# Patient Record
Sex: Female | Born: 1985 | Race: White | Hispanic: No | Marital: Single | Smoking: Never smoker
Health system: Southern US, Community
[De-identification: ages and names within clinical notes are randomized; demographics above are authoritative.]

## PROBLEM LIST (undated history)

## (undated) DIAGNOSIS — F988 Other specified behavioral and emotional disorders with onset usually occurring in childhood and adolescence: Secondary | ICD-10-CM

## (undated) DIAGNOSIS — A64 Unspecified sexually transmitted disease: Secondary | ICD-10-CM

## (undated) HISTORY — PX: WISDOM TOOTH EXTRACTION: SHX21

## (undated) HISTORY — PX: OTHER SURGICAL HISTORY: SHX169

## (undated) HISTORY — DX: Unspecified sexually transmitted disease: A64

## (undated) HISTORY — DX: Other specified behavioral and emotional disorders with onset usually occurring in childhood and adolescence: F98.8

---
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
## 2005-05-17 ENCOUNTER — Other Ambulatory Visit: Admission: RE | Admit: 2005-05-17 | Discharge: 2005-05-17 | Payer: Self-pay | Admitting: *Deleted

## 2005-05-17 ENCOUNTER — Other Ambulatory Visit: Admission: RE | Admit: 2005-05-17 | Discharge: 2005-05-17 | Payer: Self-pay | Admitting: Obstetrics and Gynecology

## 2006-07-18 ENCOUNTER — Other Ambulatory Visit: Admission: RE | Admit: 2006-07-18 | Discharge: 2006-07-18 | Payer: Self-pay | Admitting: Obstetrics & Gynecology

## 2007-08-18 ENCOUNTER — Other Ambulatory Visit: Admission: RE | Admit: 2007-08-18 | Discharge: 2007-08-18 | Payer: Self-pay | Admitting: Obstetrics and Gynecology

## 2008-09-14 ENCOUNTER — Other Ambulatory Visit: Admission: RE | Admit: 2008-09-14 | Discharge: 2008-09-14 | Payer: Self-pay | Admitting: Obstetrics and Gynecology

## 2009-10-06 ENCOUNTER — Encounter: Admission: RE | Admit: 2009-10-06 | Discharge: 2009-10-06 | Payer: Self-pay | Admitting: Emergency Medicine

## 2011-06-15 IMAGING — CT CT HEAD W/O CM
2 series · 16 of 30 positions shown, 20 images · non-contrast
Comparison: None.

CLINICAL DATA: Seizure activity yesterday.

CT HEAD WITHOUT CONTRAST
TECHNIQUE: Contiguous axial images were obtained from the base of
the skull through the vertex without contrast.

[Series 2: head wo · axial · 0.49mm/px · z∈[+189,+300]mm · 13 of 26 slices shown, 17 images]
[im 2/26  brain]
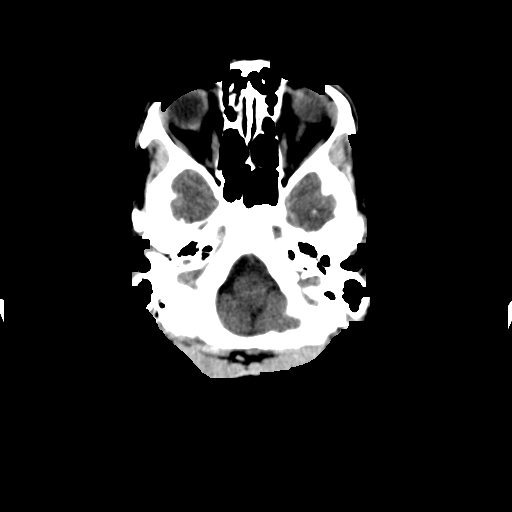
[im 2/26  bone]
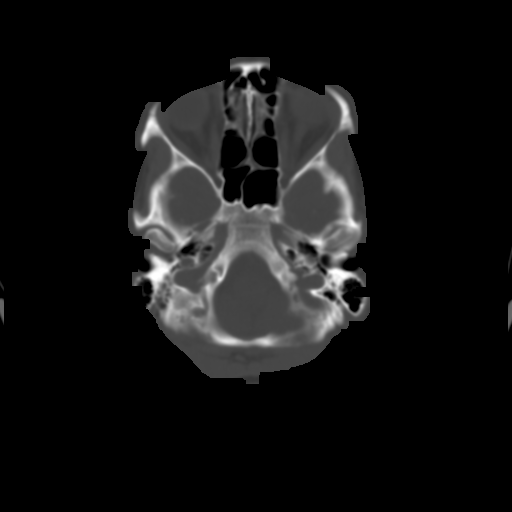
[im 4/26  brain]
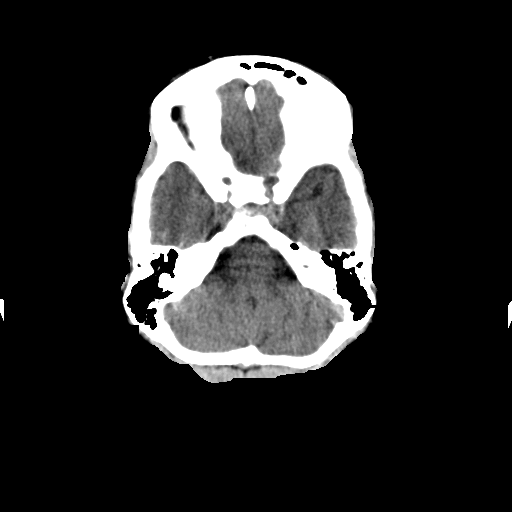
[im 6/26  brain]
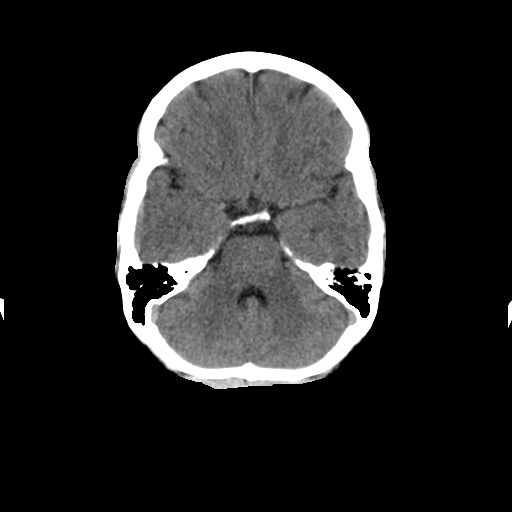
[im 8/26  brain]
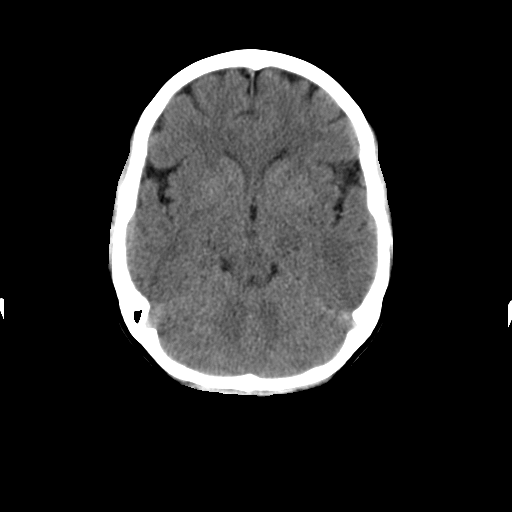
[im 9/26  brain]
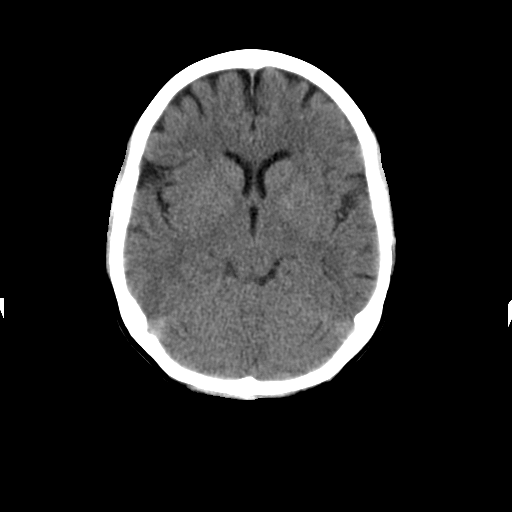
[im 9/26  bone]
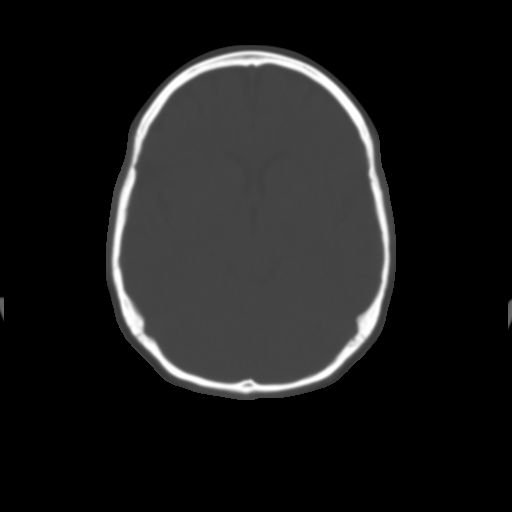
[im 11/26  brain]
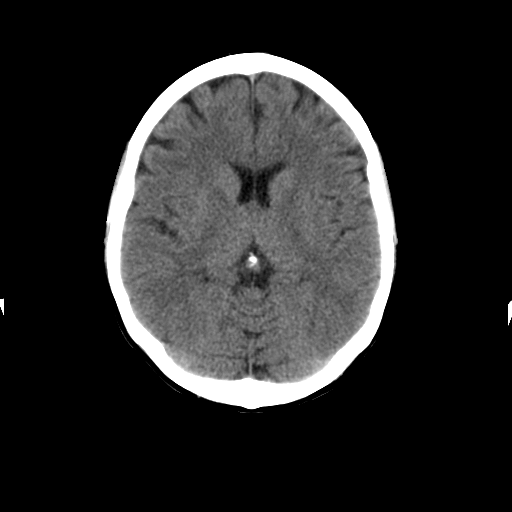
[im 13/26  brain]
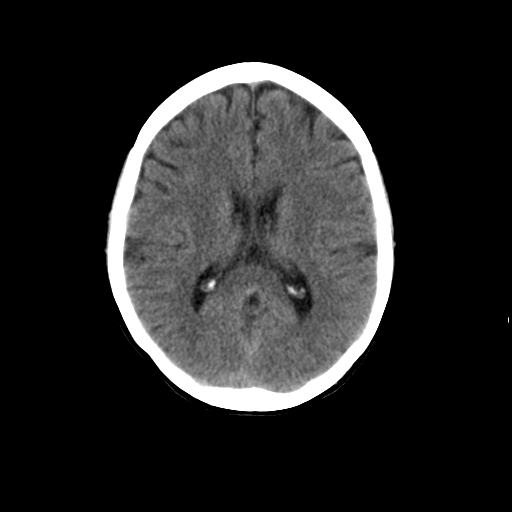
[im 15/26  brain]
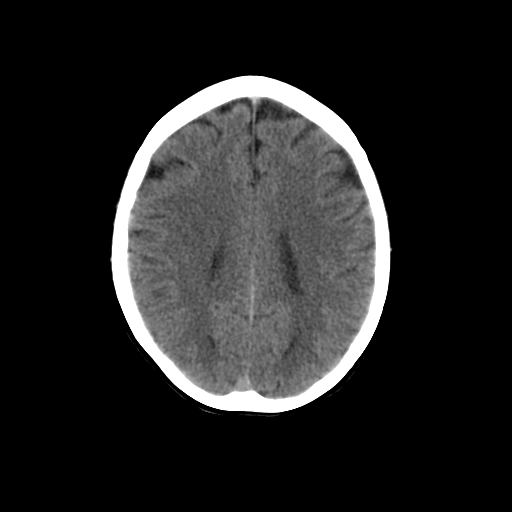
[im 17/26  brain]
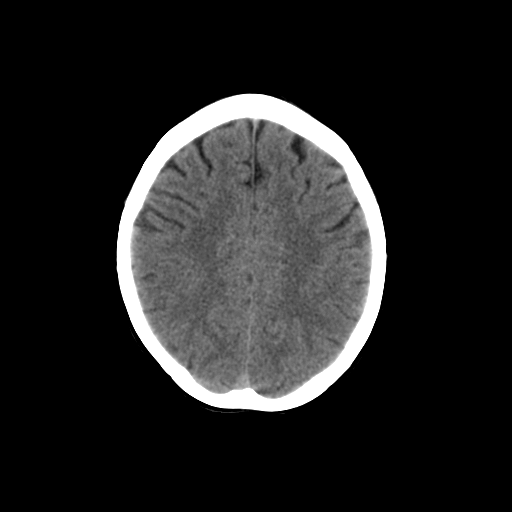
[im 17/26  bone]
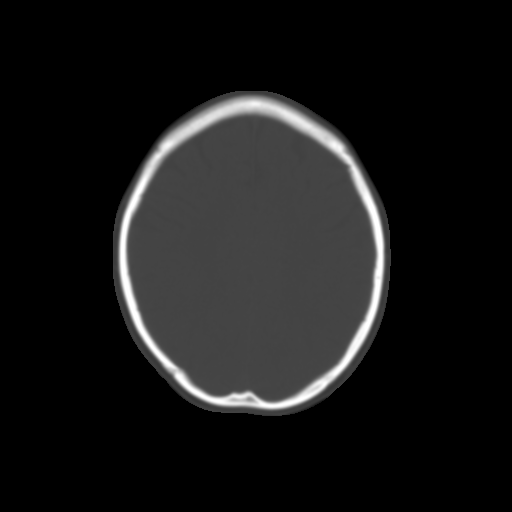
[im 18/26  brain]
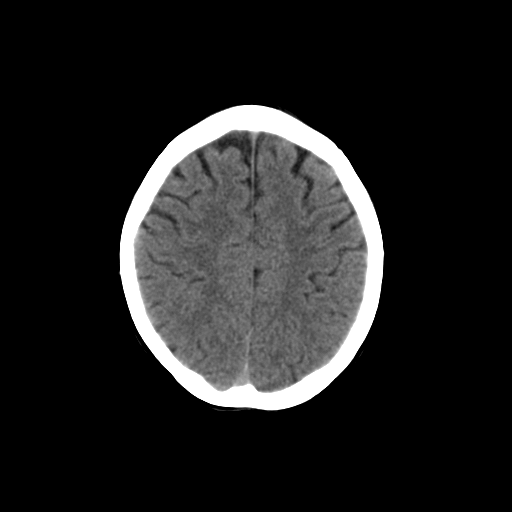
[im 20/26  brain]
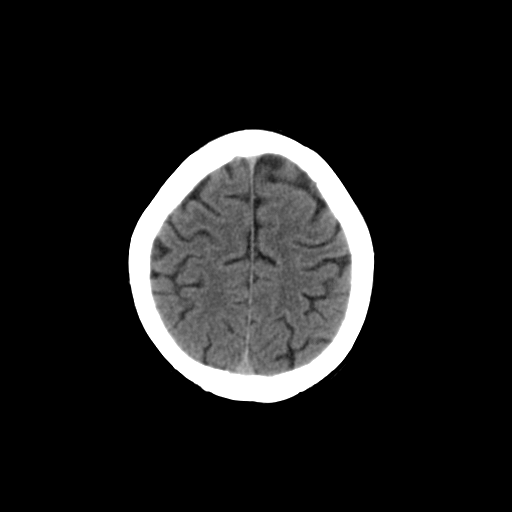
[im 22/26  brain]
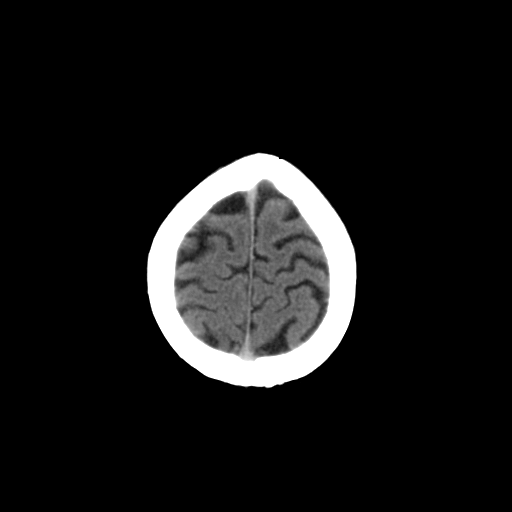
[im 24/26  brain]
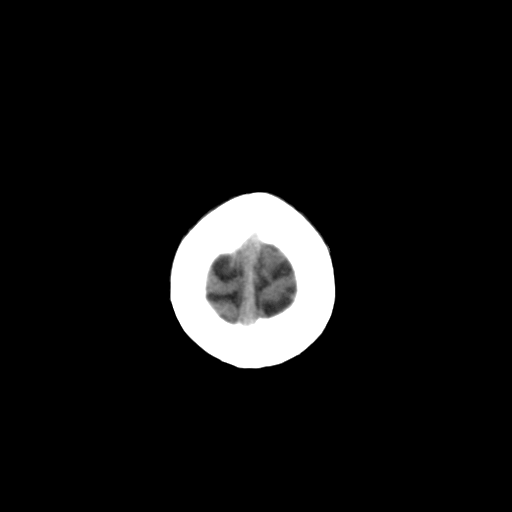
[im 24/26  bone]
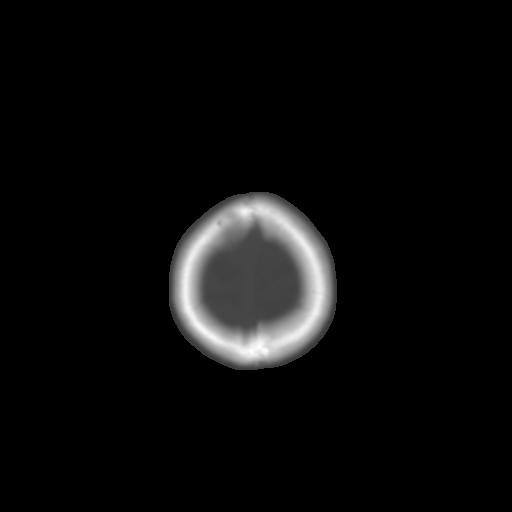

[Series 3: head bone · axial · 0.49mm/px · z∈[+189,+225]mm · 3 of 26 slices shown]
[im 2/26  bone]
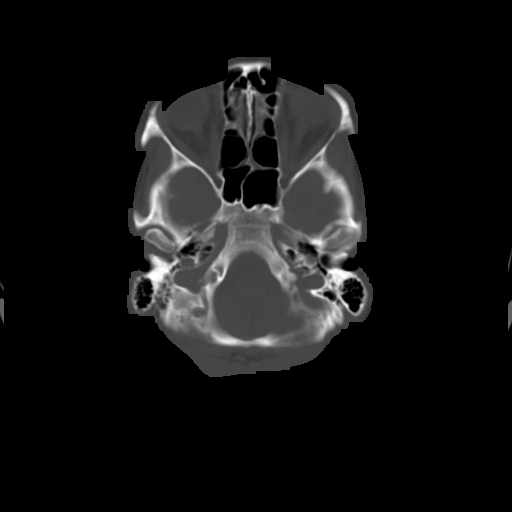
[im 6/26  bone]
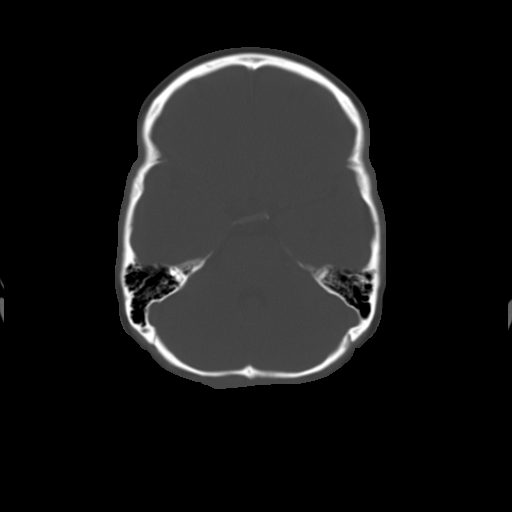
[im 9/26  bone]
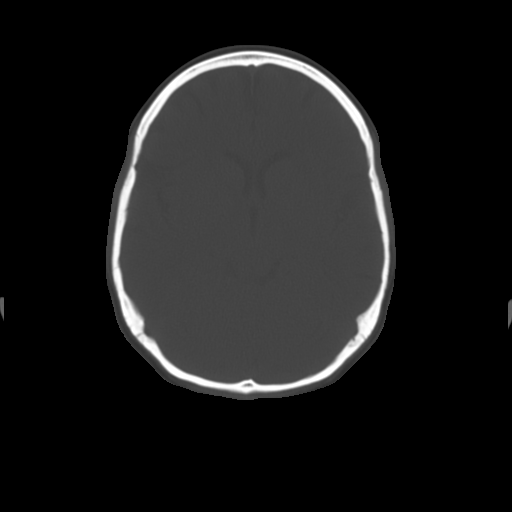

[16 of 30 positions shown; findings below may reference images not displayed]

FINDINGS: Bone windows demonstrate clear paranasal sinuses and
mastoid air cells.

Soft tissue windows demonstrate no  mass lesion, hemorrhage,
hydrocephalus, acute infarct, intra-axial, or extra-axial fluid
collection.
IMPRESSION: Normal head CT

## 2011-08-16 ENCOUNTER — Telehealth: Payer: Self-pay

## 2011-08-16 MED ORDER — AMPHETAMINE-DEXTROAMPHETAMINE 15 MG PO TABS: 15.0000 mg | ORAL_TABLET | Freq: Every day | ORAL | Status: DC

## 2011-08-16 NOTE — Telephone Encounter (Signed)
PT IN NEED OF HER ADDERALL 3 MONTHS SUPPLY PLEASE CALL [phone]

## 2011-08-16 NOTE — Telephone Encounter (Signed)
Addended by: Anders Simmonds on: 08/16/2011 08:30 PM   Modules accepted: Orders

## 2011-08-16 NOTE — Telephone Encounter (Signed)
Chart pulled to PA 

## 2011-08-16 NOTE — Telephone Encounter (Signed)
Please pull and then to PA for review.

## 2011-08-17 NOTE — Telephone Encounter (Signed)
Notified pt her request was received and is ready.

## 2011-08-22 ENCOUNTER — Telehealth: Payer: Self-pay

## 2011-08-22 NOTE — Telephone Encounter (Signed)
Deanna Chang FROM [facility] STATES THEY HAVE QUESTIONS REGARDING THE DIRECTIONS ON HER ADDERALL. PLEASE CALL [phone]   [facility]

## 2011-08-22 NOTE — Telephone Encounter (Signed)
Changed directions to bid - Spoke to pharmacist

## 2011-09-21 ENCOUNTER — Telehealth: Payer: Self-pay

## 2011-09-21 MED ORDER — AMPHETAMINE-DEXTROAMPHETAMINE 15 MG PO TABS: 15.0000 mg | ORAL_TABLET | Freq: Two times a day (BID) | ORAL | Status: DC

## 2011-09-21 NOTE — Telephone Encounter (Signed)
Pt is needing a refill on her Adderall 15mg  2 daily 60 pills

## 2011-09-21 NOTE — Telephone Encounter (Signed)
LMOM to pick rx at front desk.

## 2011-10-26 ENCOUNTER — Other Ambulatory Visit: Payer: Self-pay

## 2011-10-26 NOTE — Telephone Encounter (Signed)
Pt requesting rx refill on adderall Please call when ready for pick-up.

## 2011-10-28 NOTE — Telephone Encounter (Signed)
Please pull paper chart.  

## 2011-10-29 MED ORDER — AMPHETAMINE-DEXTROAMPHETAMINE 15 MG PO TABS: 15.0000 mg | ORAL_TABLET | Freq: Two times a day (BID) | ORAL | Status: DC

## 2011-10-29 NOTE — Telephone Encounter (Signed)
LMOM that Rx is ready and she is due for f/up bf next RF

## 2011-10-29 NOTE — Telephone Encounter (Signed)
Chart pulled to PA 

## 2011-10-29 NOTE — Telephone Encounter (Signed)
RX is ready to pick up.  Please let her know she is due for an office visit before she runs out.

## 2011-11-26 ENCOUNTER — Ambulatory Visit (INDEPENDENT_AMBULATORY_CARE_PROVIDER_SITE_OTHER): Payer: 59 | Admitting: Internal Medicine

## 2011-11-26 VITALS — BP 136/75 | HR 67 | Temp 98.3°F | Resp 16 | Ht 63.0 in | Wt 113.0 lb

## 2011-11-26 DIAGNOSIS — F988 Other specified behavioral and emotional disorders with onset usually occurring in childhood and adolescence: Secondary | ICD-10-CM

## 2011-11-26 MED ORDER — AMPHETAMINE-DEXTROAMPHETAMINE 15 MG PO TABS: 15.0000 mg | ORAL_TABLET | Freq: Two times a day (BID) | ORAL | Status: DC

## 2011-11-26 NOTE — Progress Notes (Signed)
  Subjective:    Patient ID: Deanna Chang, female    DOB: Jan 20, 1986, 26 y.o.   MRN: [id number]  HPIADD f/u  Doing very well w-s orthopedics xray tech Not needing meds as much Wants to try half dose Ran 1st marathon at [facility] No side effects    Review of Systems     Objective:   Physical Exam NAD VS WNL       Assessment & Plan:  ADD Meds ordered this encounter  Medications  . amphetamine-dextroamphetamine (ADDERALL) 15 MG tablet    Sig: Take 1 tablet (15 mg total) by mouth 2 (two) times daily. NEEDS OFFICE visit before runs out    Dispense:  60 tablet    Refill:  0  . amphetamine-dextroamphetamine (ADDERALL) 15 MG tablet    Sig: Take 1 tablet (15 mg total) by mouth 2 (two) times daily.    Dispense:  60 tablet    Refill:  0  . amphetamine-dextroamphetamine (ADDERALL) 15 MG tablet    Sig: Take 1 tablet (15 mg total) by mouth 2 (two) times daily.    Dispense:  60 tablet    Refill:  0   May try half doses Call 3 months Discussed rhodiola and zinc

## 2011-12-16 ENCOUNTER — Telehealth: Payer: Self-pay

## 2011-12-16 NOTE — Telephone Encounter (Signed)
Patient states that she needs a print out of her last ov for her flex spending and is wanting to know if she could get a Rx for the supplement that Dr Merla Riches suggested so that her flex spending will pay for the Supplement.

## 2011-12-16 NOTE — Telephone Encounter (Signed)
Can we rx Rhodiola and zinc for patient?

## 2011-12-16 NOTE — Telephone Encounter (Signed)
Dr. Merla Riches to prescribe to ensure correct dosage and sig

## 2011-12-17 NOTE — Telephone Encounter (Signed)
Called pt to pick up RX and information from Dr Merla Riches, [facility] to pick up.

## 2011-12-17 NOTE — Telephone Encounter (Signed)
Zinc 15-25 mg per day Losenges Magnesium glycinate 200-400 mg per day Rhodiola 100-200mg  /day

## 2012-12-08 ENCOUNTER — Other Ambulatory Visit: Payer: Self-pay | Admitting: Certified Nurse Midwife

## 2012-12-08 NOTE — Telephone Encounter (Signed)
eScribe request for refill on CRYSELLE Last filled - 01/03/12 X 1 YEAR Last AEX - 01/03/12 Next AEX - 01/15/13 RX sent (2 packs).

## 2013-01-15 ENCOUNTER — Ambulatory Visit: Payer: 59 | Admitting: Certified Nurse Midwife

## 2013-01-20 ENCOUNTER — Encounter: Payer: Self-pay | Admitting: Certified Nurse Midwife

## 2013-01-21 ENCOUNTER — Encounter: Payer: Self-pay | Admitting: Certified Nurse Midwife

## 2013-01-21 ENCOUNTER — Ambulatory Visit (INDEPENDENT_AMBULATORY_CARE_PROVIDER_SITE_OTHER): Payer: BC Managed Care – PPO | Admitting: Certified Nurse Midwife

## 2013-01-21 VITALS — BP 110/64 | HR 64 | Resp 16 | Ht 63.25 in | Wt 124.0 lb

## 2013-01-21 DIAGNOSIS — Z0000 Encounter for general adult medical examination without abnormal findings: Secondary | ICD-10-CM

## 2013-01-21 DIAGNOSIS — Z309 Encounter for contraceptive management, unspecified: Secondary | ICD-10-CM

## 2013-01-21 DIAGNOSIS — Z01419 Encounter for gynecological examination (general) (routine) without abnormal findings: Secondary | ICD-10-CM

## 2013-01-21 DIAGNOSIS — IMO0001 Reserved for inherently not codable concepts without codable children: Secondary | ICD-10-CM

## 2013-01-21 DIAGNOSIS — B009 Herpesviral infection, unspecified: Secondary | ICD-10-CM

## 2013-01-21 LAB — POCT URINALYSIS DIPSTICK
Bilirubin, UA: NEGATIVE
Blood, UA: NEGATIVE
Glucose, UA: NEGATIVE
Ketones, UA: NEGATIVE
Leukocytes, UA: NEGATIVE
Nitrite, UA: NEGATIVE
Protein, UA: NEGATIVE
Urobilinogen, UA: NEGATIVE
pH, UA: 5

## 2013-01-21 LAB — HIV ANTIBODY (ROUTINE TESTING W REFLEX): HIV: NONREACTIVE

## 2013-01-21 MED ORDER — NORGESTREL-ETHINYL ESTRADIOL 0.3-30 MG-MCG PO TABS: 1.0000 | ORAL_TABLET | Freq: Every day | ORAL | Status: DC

## 2013-01-21 MED ORDER — VALACYCLOVIR HCL 1 G PO TABS: 1000.0000 mg | ORAL_TABLET | ORAL | Status: AC | PRN

## 2013-01-21 NOTE — Patient Instructions (Signed)
General topics  Next pap or exam is  due in 1 year Take a Women's multivitamin Take 1200 mg. of calcium daily - prefer dietary If any concerns in interim to call back  Breast Self-Awareness Practicing breast self-awareness may pick up problems early, prevent significant medical complications, and possibly save your life. By practicing breast self-awareness, you can become familiar with how your breasts look and feel and if your breasts are changing. This allows you to notice changes early. It can also offer you some reassurance that your breast health is good. One way to learn what is normal for your breasts and whether your breasts are changing is to do a breast self-exam. If you find a lump or something that was not present in the past, it is best to contact your caregiver right away. Other findings that should be evaluated by your caregiver include nipple discharge, especially if it is bloody; skin changes or reddening; areas where the skin seems to be pulled in (retracted); or new lumps and bumps. Breast pain is seldom associated with cancer (malignancy), but should also be evaluated by a caregiver. BREAST SELF-EXAM The best time to examine your breasts is 5 7 days after your menstrual period is over.  ExitCare Patient Information 2013 [employer].   Exercise to Stay Healthy Exercise helps you become and stay healthy. EXERCISE IDEAS AND TIPS Choose exercises that:  You enjoy.  Fit into your day. You do not need to exercise really hard to be healthy. You can do exercises at a slow or medium level and stay healthy. You can:  Stretch before and after working out.  Try yoga, Pilates, or tai chi.  Lift weights.  Walk fast, swim, jog, run, climb stairs, bicycle, dance, or rollerskate.  Take aerobic classes. Exercises that burn about 150 calories:  Running 1  miles in 15 minutes.  Playing volleyball for 45 to 60 minutes.  Washing and waxing a car for 45 to 60  minutes.  Playing touch football for 45 minutes.  Walking 1  miles in 35 minutes.  Pushing a stroller 1  miles in 30 minutes.  Playing basketball for 30 minutes.  Raking leaves for 30 minutes.  Bicycling 5 miles in 30 minutes.  Walking 2 miles in 30 minutes.  Dancing for 30 minutes.  Shoveling snow for 15 minutes.  Swimming laps for 20 minutes.  Walking up stairs for 15 minutes.  Bicycling 4 miles in 15 minutes.  Gardening for 30 to 45 minutes.  Jumping rope for 15 minutes.  Washing windows or floors for 45 to 60 minutes. Document Released: 06/16/2010 Document Revised: 08/06/2011 Document Reviewed: 06/16/2010 ExitCare Patient Information 2013 [employer].   Other topics ( that may be useful information):    Sexually Transmitted Disease Sexually transmitted disease (STD) refers to any infection that is passed from person to person during sexual activity. This may happen by way of saliva, semen, blood, vaginal mucus, or urine. Common STDs include:  Gonorrhea.  Chlamydia.  Syphilis.  HIV/AIDS.  Genital herpes.  Hepatitis B and C.  Trichomonas.  Human papillomavirus (HPV).  Pubic lice. CAUSES  An STD may be spread by bacteria, virus, or parasite. A person can get an STD by:  Sexual intercourse with an infected person.  Sharing sex toys with an infected person.  Sharing needles with an infected person.  Having intimate contact with the genitals, mouth, or rectal areas of an infected person. SYMPTOMS  Some people may not have any symptoms, but   they can still pass the infection to others. Different STDs have different symptoms. Symptoms include:  Painful or bloody urination.  Pain in the pelvis, abdomen, vagina, anus, throat, or eyes.  Skin rash, itching, irritation, growths, or sores (lesions). These usually occur in the genital or anal area.  Abnormal vaginal discharge.  Penile discharge in men.  Soft, flesh-colored skin growths in the  genital or anal area.  Fever.  Pain or bleeding during sexual intercourse.  Swollen glands in the groin area.  Yellow skin and eyes (jaundice). This is seen with hepatitis. DIAGNOSIS  To make a diagnosis, your caregiver may:  Take a medical history.  Perform a physical exam.  Take a specimen (culture) to be examined.  Examine a sample of discharge under a microscope.  Perform blood test TREATMENT   Chlamydia, gonorrhea, trichomonas, and syphilis can be cured with antibiotic medicine.  Genital herpes, hepatitis, and HIV can be treated, but not cured, with prescribed medicines. The medicines will lessen the symptoms.  Genital warts from HPV can be treated with medicine or by freezing, burning (electrocautery), or surgery. Warts may come back.  HPV is a virus and cannot be cured with medicine or surgery.However, abnormal areas may be followed very closely by your caregiver and may be removed from the cervix, vagina, or vulva through office procedures or surgery. If your diagnosis is confirmed, your recent sexual partners need treatment. This is true even if they are symptom-free or have a negative culture or evaluation. They should not have sex until their caregiver says it is okay. HOME CARE INSTRUCTIONS  All sexual partners should be informed, tested, and treated for all STDs.  Take your antibiotics as directed. Finish them even if you start to feel better.  Only take over-the-counter or prescription medicines for pain, discomfort, or fever as directed by your caregiver.  Rest.  Eat a balanced diet and drink enough fluids to keep your urine clear or pale yellow.  Do not have sex until treatment is completed and you have followed up with your caregiver. STDs should be checked after treatment.  Keep all follow-up appointments, Pap tests, and blood tests as directed by your caregiver.  Only use latex condoms and water-soluble lubricants during sexual activity. Do not use  petroleum jelly or oils.  Avoid alcohol and illegal drugs.  Get vaccinated for HPV and hepatitis. If you have not received these vaccines in the past, talk to your caregiver about whether one or both might be right for you.  Avoid risky sex practices that can break the skin. The only way to avoid getting an STD is to avoid all sexual activity.Latex condoms and dental dams (for oral sex) will help lessen the risk of getting an STD, but will not completely eliminate the risk. SEEK MEDICAL CARE IF:   You have a fever.  You have any new or worsening symptoms. Document Released: 08/04/2002 Document Revised: 08/06/2011 Document Reviewed: 08/11/2010 ExitCare Patient Information 2013 [employer].    Domestic Abuse You are being battered or abused if someone close to you hits, pushes, or physically hurts you in any way. You also are being abused if you are forced into activities. You are being sexually abused if you are forced to have sexual contact of any kind. You are being emotionally abused if you are made to feel worthless or if you are constantly threatened. It is important to remember that help is available. No one has the right to abuse you. PREVENTION OF FURTHER   ABUSE  Learn the warning signs of danger. This varies with situations but may include: the use of alcohol, threats, isolation from friends and family, or forced sexual contact. Leave if you feel that violence is going to occur.  If you are attacked or beaten, report it to the police so the abuse is documented. You do not have to press charges. The police can protect you while you or the attackers are leaving. Get the officer's name and badge number and a copy of the report.  Find someone you can trust and tell them what is happening to you: your caregiver, a nurse, clergy member, close friend or family member. Feeling ashamed is natural, but remember that you have done nothing wrong. No one deserves abuse. Document Released:  05/11/2000 Document Revised: 08/06/2011 Document Reviewed: 07/20/2010 ExitCare Patient Information 2013 [employer].    How Much is Too Much Alcohol? Drinking too much alcohol can cause injury, accidents, and health problems. These types of problems can include:   Car crashes.  Falls.  Family fighting (domestic violence).  Drowning.  Fights.  Injuries.  Burns.  Damage to certain organs.  Having a baby with birth defects. ONE DRINK CAN BE TOO MUCH WHEN YOU ARE:  Working.  Pregnant or breastfeeding.  Taking medicines. Ask your doctor.  Driving or planning to drive. If you or someone you know has a drinking problem, get help from a doctor.  Document Released: 03/10/2009 Document Revised: 08/06/2011 Document Reviewed: 03/10/2009 ExitCare Patient Information 2013 [employer].   Smoking Hazards Smoking cigarettes is extremely bad for your health. Tobacco smoke has over 200 known poisons in it. There are over 60 chemicals in tobacco smoke that cause cancer. Some of the chemicals found in cigarette smoke include:   Cyanide.  Benzene.  Formaldehyde.  Methanol (wood alcohol).  Acetylene (fuel used in welding torches).  Ammonia. Cigarette smoke also contains the poisonous gases nitrogen oxide and carbon monoxide.  Cigarette smokers have an increased risk of many serious medical problems and Smoking causes approximately:  90% of all lung cancer deaths in men.  80% of all lung cancer deaths in women.  90% of deaths from chronic obstructive lung disease. Compared with nonsmokers, smoking increases the risk of:  Coronary heart disease by 2 to 4 times.  Stroke by 2 to 4 times.  Men developing lung cancer by 23 times.  Women developing lung cancer by 13 times.  Dying from chronic obstructive lung diseases by 12 times.  . Smoking is the most preventable cause of death and disease in our society.  WHY IS SMOKING ADDICTIVE?  Nicotine is the chemical  agent in tobacco that is capable of causing addiction or dependence.  When you smoke and inhale, nicotine is absorbed rapidly into the bloodstream through your lungs. Nicotine absorbed through the lungs is capable of creating a powerful addiction. Both inhaled and non-inhaled nicotine may be addictive.  Addiction studies of cigarettes and spit tobacco show that addiction to nicotine occurs mainly during the teen years, when young people begin using tobacco products. WHAT ARE THE BENEFITS OF QUITTING?  There are many health benefits to quitting smoking.   Likelihood of developing cancer and heart disease decreases. Health improvements are seen almost immediately.  Blood pressure, pulse rate, and breathing patterns start returning to normal soon after quitting. QUITTING SMOKING   American Lung Association - 1-800-LUNGUSA  American Cancer Society - 1-800-ACS-2345 Document Released: 06/21/2004 Document Revised: 08/06/2011 Document Reviewed: 02/23/2009 ExitCare Patient Information 2013 ExitCare,   LLC.   Stress Management Stress is a state of physical or mental tension that often results from changes in your life or normal routine. Some common causes of stress are:  Death of a loved one.  Injuries or severe illnesses.  Getting fired or changing jobs.  Moving into a new home. Other causes may be:  Sexual problems.  Business or financial losses.  Taking on a large debt.  Regular conflict with someone at home or at work.  Constant tiredness from lack of sleep. It is not just bad things that are stressful. It may be stressful to:  Win the lottery.  Get married.  Buy a new car. The amount of stress that can be easily tolerated varies from person to person. Changes generally cause stress, regardless of the types of change. Too much stress can affect your health. It may lead to physical or emotional problems. Too little stress (boredom) may also become stressful. SUGGESTIONS TO  REDUCE STRESS:  Talk things over with your family and friends. It often is helpful to share your concerns and worries. If you feel your problem is serious, you may want to get help from a professional counselor.  Consider your problems one at a time instead of lumping them all together. Trying to take care of everything at once may seem impossible. List all the things you need to do and then start with the most important one. Set a goal to accomplish 2 or 3 things each day. If you expect to do too many in a single day you will naturally fail, causing you to feel even more stressed.  Do not use alcohol or drugs to relieve stress. Although you may feel better for a short time, they do not remove the problems that caused the stress. They can also be habit forming.  Exercise regularly - at least 3 times per week. Physical exercise can help to relieve that "uptight" feeling and will relax you.  The shortest distance between despair and hope is often a good night's sleep.  Go to bed and get up on time allowing yourself time for appointments without being rushed.  Take a short "time-out" period from any stressful situation that occurs during the day. Close your eyes and take some deep breaths. Starting with the muscles in your face, tense them, hold it for a few seconds, then relax. Repeat this with the muscles in your neck, shoulders, hand, stomach, back and legs.  Take good care of yourself. Eat a balanced diet and get plenty of rest.  Schedule time for having fun. Take a break from your daily routine to relax. HOME CARE INSTRUCTIONS   Call if you feel overwhelmed by your problems and feel you can no longer manage them on your own.  Return immediately if you feel like hurting yourself or someone else. Document Released: 11/07/2000 Document Revised: 08/06/2011 Document Reviewed: 06/30/2007 ExitCare Patient Information 2013 [employer].   

## 2013-01-21 NOTE — Progress Notes (Signed)
27 y.o. G0P0000 Single Caucasian Fe here for annual exam.  Periods normal. Contraception working well, but may consider, long term method, IUD,nexplanon. Desires STD screening. Patient has been off Aderal for a year now doing well. No health issues today.  Patient's last menstrual period was 12/29/2012.          Sexually active: yes  The current method of family planning is OCP (estrogen/progesterone).    Exercising: yes  power yoga,circuit training with weights, running & biking Smoker:  no  Health Maintenance: Pap:  01-03-12 neg MMG:  none Colonoscopy:  none BMD:   none TDaP:  2006 Labs: Poct urine-neg, hgb-13.7 Self breast exam: done monthly   reports that she has never smoked. She does not have any smokeless tobacco history on file. She reports that she drinks about 1.5 ounces of alcohol per week. She reports that she does not use illicit drugs.  Past Medical History  Diagnosis Date  . STD (sexually transmitted disease)     HSV1  . ADD (attention deficit disorder)     Past Surgical History  Procedure Laterality Date  . Wisdom tooth extraction    . Jaw history      Current Outpatient Prescriptions  Medication Sig Dispense Refill  . IBUPROFEN PO Take by mouth as needed.      . norgestrel-ethinyl estradiol (CRYSELLE-28) 0.3-30 MG-MCG tablet TAKE 1 TABLET BY MOUTH EVERY DAY  28 tablet  1  . valACYclovir (VALTREX) 1000 MG tablet Take 1,000 mg by mouth as needed.       No current facility-administered medications for this visit.    Family History  Problem Relation Age of Onset  . Stroke Maternal Grandmother     ROS:  Pertinent items are noted in HPI.  Otherwise, a comprehensive ROS was negative.  Exam:   BP 110/64  Pulse 64  Resp 16  Ht 5' 3.25" (1.607 m)  Wt 124 lb (56.246 kg)  BMI 21.78 kg/m2  LMP 12/29/2012 Height: 5' 3.25" (160.7 cm)  Ht Readings from Last 3 Encounters:  01/21/13 5' 3.25" (1.607 m)  11/26/11 5\' 3"  (1.6 m)    General appearance: alert,  cooperative and appears stated age Head: Normocephalic, without obvious abnormality, atraumatic Neck: no adenopathy, supple, symmetrical, trachea midline and thyroid normal to inspection and palpation Lungs: clear to auscultation bilaterally Breasts: normal appearance, no masses or tenderness, No nipple retraction or dimpling, No nipple discharge or bleeding, No axillary or supraclavicular adenopathy Heart: regular rate and rhythm Abdomen: soft, non-tender; no masses,  no organomegaly Extremities: extremities normal, atraumatic, no cyanosis or edema Skin: Skin color, texture, turgor normal. No rashes or lesions Lymph nodes: Cervical, supraclavicular, and axillary nodes normal. No abnormal inguinal nodes palpated Neurologic: Grossly normal   Pelvic: External genitalia:  no lesions              Urethra:  normal appearing urethra with no masses, tenderness or lesions              Bartholin's and Skene's: normal                 Vagina: normal appearing vagina with normal color and discharge, no lesions              Cervix: normal, non tender              Pap taken: no Bimanual Exam:  Uterus:  normal size, contour, position, consistency, mobility, non-tender and anteverted  Adnexa: normal adnexa and no mass, fullness, tenderness               Rectovaginal: Confirms               Anus:deferred  A:  Well Woman with normal exam  Contraception desires OCP, but requests IUD, Nexplanon information  STD screening  History of HSV I, needs Rx  P:   Reviewed health and wellness pertinent to exam  Rx Cryselle see order Given information on insertion,removal and bleeding profile with SKYLA,Mirena, and Nexplanon. Questions addressed. Given information to call insurance. Aware will need insertion on period, so continue OCP ;use. Will call if decides to make change.  Lab GC,Chlamydia, HIV,RPR  Rx Valtrex see order  Pap smear as per guidelines   pap smear not taken today  counseled on  breast self exam, STD prevention, HIV risk factors and prevention, use and side effects of OCP's, adequate intake of calcium and vitamin D, diet and exercise  return annually or prn  An After Visit Summary was printed and given to the patient.

## 2013-01-21 NOTE — Progress Notes (Signed)
Note reviewed, agree with plan.  Rewa Weissberg, MD  

## 2013-01-22 LAB — IPS N GONORRHOEA AND CHLAMYDIA BY PCR

## 2013-04-02 ENCOUNTER — Other Ambulatory Visit: Payer: Self-pay

## 2013-08-12 ENCOUNTER — Telehealth: Payer: Self-pay | Admitting: Certified Nurse Midwife

## 2013-08-12 NOTE — Telephone Encounter (Signed)
Spoke with patient. Increased vaginal discharge. Requests office visit for Friday. Declines office visit for today. Scheduled appointment with Dr. Farrel GobbleLathrop for Friday.  Routing to provider for final review. Patient agreeable to disposition. Will close encounter

## 2013-08-12 NOTE — Telephone Encounter (Signed)
Patient is having some green discharge.(started last night) And for the last month has been having itching and some discharge off and on but has not been treated. NO change in sexual partners or any other habits. Does a lot of hot yoga and that is usually when she shad the symptoms but when she changes it goes away.

## 2013-08-14 ENCOUNTER — Ambulatory Visit: Payer: BC Managed Care – PPO | Admitting: Gynecology

## 2013-08-14 ENCOUNTER — Ambulatory Visit (INDEPENDENT_AMBULATORY_CARE_PROVIDER_SITE_OTHER): Payer: BC Managed Care – PPO | Admitting: Obstetrics & Gynecology

## 2013-08-14 ENCOUNTER — Encounter: Payer: Self-pay | Admitting: Obstetrics & Gynecology

## 2013-08-14 VITALS — BP 122/80 | HR 64 | Resp 16 | Ht 63.25 in | Wt 120.8 lb

## 2013-08-14 DIAGNOSIS — N912 Amenorrhea, unspecified: Secondary | ICD-10-CM

## 2013-08-14 DIAGNOSIS — N898 Other specified noninflammatory disorders of vagina: Secondary | ICD-10-CM

## 2013-08-14 MED ORDER — METRONIDAZOLE 500 MG PO TABS: 500.0000 mg | ORAL_TABLET | Freq: Two times a day (BID) | ORAL | Status: DC

## 2013-08-14 NOTE — Progress Notes (Signed)
Subjective:     Patient ID: Deanna AskewJessica L Chang, female   DOB: [birth year], 28 y.o.   MRN: [id number]  HPI 28 yo G0 SWF here for complaints for vaginal discharge and itching a little over a month ago.  Felt like yeast to her except there wasn't much itching.  Tuesday, after yoga, the notice bright greenish discharge.  There is a little mild odor.    With same partner x 2 years.   H/O chlamydia.  Requests STD testing today.  Denies pelvic pain, fever, dysuria.  Hasn't had a cycle since 12/14.  Stopped OCPs.  Is using condoms.  Ok if gets pregnant.  [place]2 marathon.  Wished luck!  Review of Systems  All other systems reviewed and are negative.       Objective:   Physical Exam  Constitutional: She is oriented to person, place, and time. She appears well-developed and well-nourished.  Abdominal: Soft. Bowel sounds are normal.  Genitourinary: Uterus normal. There is no rash, tenderness or lesion on the right labia. There is no rash, tenderness or lesion on the left labia. Cervix exhibits friability (mild). Right adnexum displays no mass and no tenderness. Left adnexum displays no mass and no tenderness. No erythema or tenderness around the vagina. Vaginal discharge (greenish and frothy) found.  Wet smear:  Ph 5.0.  Saline with nl epithelial cells.  No clue cells.  No trich.  KOH without yeast and no whiff.  Musculoskeletal: Normal range of motion.  Lymphadenopathy:       Right: No inguinal adenopathy present.       Left: No inguinal adenopathy present.  Neurological: She is alert and oriented to person, place, and time.  Psychiatric: She has a normal mood and affect.       Assessment:     Vaginal discharge Amenorrhea     Plan:     Gc/Chl pending TSH, Qual HCG, FSH, prolactin  Flagyl 500mg  bid x 7 days.  rx to pharmacy.

## 2013-08-15 LAB — FOLLICLE STIMULATING HORMONE: FSH: 7.2 m[IU]/mL

## 2013-08-15 LAB — HCG, SERUM, QUALITATIVE: PREG SERUM: NEGATIVE

## 2013-08-15 LAB — PROLACTIN: Prolactin: 2.7 ng/mL

## 2013-08-15 LAB — TSH: TSH: 1.759 u[IU]/mL (ref 0.350–4.500)

## 2013-08-18 LAB — IPS N GONORRHOEA AND CHLAMYDIA BY PCR

## 2013-08-19 ENCOUNTER — Telehealth: Payer: Self-pay

## 2013-08-19 NOTE — Telephone Encounter (Signed)
Message copied by Elisha HeadlandNIX, Aeva Posey S on Wed Aug 19, 2013 11:39 AM ------      Message from: Jerene BearsMILLER, MARY S      Created: Tue Aug 18, 2013 12:52 PM       Pt needs to take Provera 10mg  x 10 days to start cycle as has not has one since December.  Please call and discuss.  Informed of normal results via Mychart but that you would call to discuss the Provera.  No order placed yet.            Deanna Chang,      Your lab tests are normal.  [facility].  Your thyroid test is normal.  A pregnancy test was negative.  And the prolactin test was normal.  You should have a cycle about every three to four months.  My nurse will call and talk with you about this.  The STD testing isn't back yet.            Dr. Hyacinth MeekerMiller ------

## 2013-08-19 NOTE — Telephone Encounter (Signed)
Lmtcb//kn 

## 2013-08-20 MED ORDER — MEDROXYPROGESTERONE ACETATE 10 MG PO TABS: 10.0000 mg | ORAL_TABLET | Freq: Every day | ORAL | Status: DC

## 2013-08-20 NOTE — Telephone Encounter (Signed)
Pt says her Rx for provera was not at the [facility] at [phone]

## 2013-08-20 NOTE — Telephone Encounter (Signed)
Message left to return call [facility] [phone].   Mychart message sent as well. Rx sent to [facility] [employer].

## 2013-08-26 NOTE — Telephone Encounter (Signed)
Did anybody actually speak to my patient?  I want Ms. Sutphin to call and give report after taking the provera to advise if she either has + bleeding or neg bleeding.  Can you please call her?

## 2013-08-26 NOTE — Telephone Encounter (Signed)
Patient has read my chart message. Okay to close encounter?

## 2013-08-27 NOTE — Telephone Encounter (Signed)
Left message to call Kaitlyn at [phone]. 

## 2013-08-27 NOTE — Telephone Encounter (Signed)
Patient is calling Kaitlyn back °

## 2013-08-27 NOTE — Telephone Encounter (Signed)
Spoke with patient. Message from Dr.Miller given (as seen below). Advised she should begin to take the Provera 10 mg for 10 days. Instructed to call our office back after she has completed Provera to let us know if she has or has not started a period. Advised this could take up to two weeks after completing Provera. Patient agreeable and verbalizes understanding. Will call back to let us know results.  Routing to provider for final review. Patient agreeable to disposition. Will close encounter

## 2013-09-26 ENCOUNTER — Encounter: Payer: Self-pay | Admitting: Obstetrics & Gynecology

## 2013-09-28 ENCOUNTER — Telehealth: Payer: Self-pay | Admitting: Obstetrics & Gynecology

## 2013-09-28 ENCOUNTER — Telehealth: Payer: Self-pay | Admitting: *Deleted

## 2013-09-28 NOTE — Telephone Encounter (Signed)
Message left to return [facility] to [facility] [phone].   Accidentally closed encounter. Will addend when patient returns call.

## 2013-09-28 NOTE — Telephone Encounter (Signed)
Patient is returning a call, not sure to who. It looks like Kennon RoundsSally left a msg for patient.?

## 2013-09-28 NOTE — Telephone Encounter (Signed)
[facility] to [facility], ask for [facility].

## 2013-09-28 NOTE — Telephone Encounter (Signed)
From [name], RN [[id number]][[id number]] To Deanna AskewJessica Chang Sterne Composed 09/28/2013 1:49 PM For Delivery On 09/28/2013 1:49 PM Subject RE: Non-Urgent Medical Question Message Type Patient Medical Advice Request Read Status N By Deanna AskewJessica Chang [name] has not read the message Message Body [name], I left a message on your VM to call the office to review with triage. We need to ask questions and see more about your symptoms.   ----- Message -----  From: Deanna Chang,Deanna Chang  Sent: 09/26/2013 3:50 PM EDT  To: Annamaria BootsMILLER, MARY SUZANNE, MD  Subject: Non-Urgent Medical Question   I finished the Flagyl I was prescribed on 08/21/13. The discharge I was experiencing went away for approx. 2 weeks at which point it returned with a strong odor, characteristic of BV. Is it possible to try another round of antibiotics? Also, I have still not started my cycle after finishing the Provera on 08/09/13.

## 2013-09-28 NOTE — Telephone Encounter (Signed)
My Chart message received and call to patient to assess.  LMTCB.  Call to triage.

## 2013-09-28 NOTE — Telephone Encounter (Signed)
Return call to Triage. °

## 2013-09-28 NOTE — Telephone Encounter (Signed)
Spoke with patient. Patient states that she was seen on 3/20 by Dr.Miller for discharge. Patient states she was given Flagyl and symptoms went away. Patient states that the discharge is now back. Discharge is light yellow in color with a foul odor. Patient denies itching, burning, abdominal pain, fevers, and chills. Patient states it was mentioned she may need another round of antibiotics if symptoms came back. Advised would send a message to Dr.Miller about medication refill and give patient a call back with further instructions. Advised may need office visit to determine proper treatment. Patient would also like Dr.Miller to know she has not had a menses and completed Provera on 08/09/13. Would like to know what she needs to do for further care. Advised would include this is the message to Dr.Miller and give patient a call back with further instructions.

## 2013-09-28 NOTE — Telephone Encounter (Signed)
Telephone call to patient, see phone note.

## 2013-09-29 NOTE — Telephone Encounter (Signed)
Needs appt as pt also hasn't started her cycle yet.

## 2013-09-29 NOTE — Telephone Encounter (Signed)
Spoke with patient. Advised patient will need to come in for office visit for evaluation with Dr.Miller. Patient agreeable. Offered appointment for Friday May 8th at 1:30 but patient declines due to work schedule. Patient states that the only day she can come is May 7th. Patient declines appointment for any time next week stating "We are having to switch over things at work and we are not allowed to take any time off. I am unable to come in for a while other than May 7th." Advised would send a message to Dr.Miller as there is currently no availability for May 7th and will give patient a call back. Patient agreeable.

## 2013-09-29 NOTE — Telephone Encounter (Signed)
Spoke with patient. Advised appointment available for 5/7 at 3:30 with Dr.Miller (time per Kennon RoundsSally). Patient agreeable. Appointment scheduled.

## 2013-10-01 ENCOUNTER — Encounter: Payer: Self-pay | Admitting: Obstetrics & Gynecology

## 2013-10-01 ENCOUNTER — Ambulatory Visit (INDEPENDENT_AMBULATORY_CARE_PROVIDER_SITE_OTHER): Payer: BC Managed Care – PPO | Admitting: Obstetrics & Gynecology

## 2013-10-01 VITALS — BP 110/70 | HR 80 | Temp 98.1°F | Wt 118.0 lb

## 2013-10-01 DIAGNOSIS — N898 Other specified noninflammatory disorders of vagina: Secondary | ICD-10-CM

## 2013-10-01 DIAGNOSIS — N912 Amenorrhea, unspecified: Secondary | ICD-10-CM

## 2013-10-01 NOTE — Progress Notes (Signed)
Subjective:     Patient ID: Deanna AskewJessica L Crickenberger, female   DOB: May 05, 1986, 28 y.o.   MRN: [id number]  HPI 28 yo G0 SWF here for two issues.  First is amenorrhea.  This started when she stopped her pills in December.  TSH, FSH, prolactin, and HCG were all neg/normal.  Provera challenge did not result in bleeding.    Avid runner.  Runs 25-35 miles a week.  [address] run was her last run.  She now has a possible stress fracture in her tibia.  On 8 weeks of rest right now.  Did wear a boot for one week.  Did have pain with walking and that was why she was placed in the boot.  Dr. Cheri FowlerKornegay is seeing her for these issues.  May need to have an MRI.  She is waiting on this right now.    Discharge is light yellow and has a foul odor.  GC/Chl neg in March.  Took the Flagyl and symptoms were gone about two weeks.  She feels the symptoms returned after the second or third day of Provera.    Pt with significant other of several years.  Review of Systems     Objective:   Physical Exam  Constitutional: She is oriented to person, place, and time. She appears well-developed and well-nourished.  Abdominal: Soft. Bowel sounds are normal.  Genitourinary: Uterus normal. There is no rash or tenderness on the right labia. There is no rash or tenderness on the left labia. Cervix exhibits no motion tenderness. Right adnexum displays no mass, no tenderness and no fullness. Left adnexum displays no mass, no tenderness and no fullness. Vaginal discharge found.  Lymphadenopathy:       Right: No inguinal adenopathy present.       Left: No inguinal adenopathy present.  Neurological: She is alert and oriented to person, place, and time.  Skin: Skin is warm and dry.       Assessment:     Amenorrhea off OCPs for 5 months Vaginal discharge and odor     Plan:     FSH/LH Estradiol Affirm PUS Consider MRI of pituitary if above negative.

## 2013-10-02 LAB — WET PREP BY MOLECULAR PROBE
Candida species: POSITIVE — AB
Gardnerella vaginalis: POSITIVE — AB
Trichomonas vaginosis: NEGATIVE

## 2013-10-02 LAB — FSH/LH
FSH: 7.4 m[IU]/mL
LH: 5.6 m[IU]/mL

## 2013-10-02 LAB — ESTRADIOL: Estradiol: 37.7 pg/mL

## 2013-10-05 ENCOUNTER — Telehealth: Payer: Self-pay | Admitting: Obstetrics & Gynecology

## 2013-10-05 ENCOUNTER — Telehealth: Payer: Self-pay | Admitting: Gynecology

## 2013-10-05 MED ORDER — FLUCONAZOLE 150 MG PO TABS: 150.0000 mg | ORAL_TABLET | Freq: Once | ORAL | Status: DC

## 2013-10-05 MED ORDER — METRONIDAZOLE 0.75 % VA GEL: 1.0000 | Freq: Every day | VAGINAL | Status: DC

## 2013-10-05 NOTE — Telephone Encounter (Signed)
Rx for metrogel and diflucan done to local pharmacy.  Please advise pt.

## 2013-10-05 NOTE — Telephone Encounter (Signed)
Patient calling re: MyChart message...  Please see message. Patient calling to go over symptoms.

## 2013-10-05 NOTE — Telephone Encounter (Signed)
Notes Recorded by Deanna BootsMary Suzanne Miller, MD on 10/02/2013 at 4:33 PM Notified Via MyChart of results.  Deanna Chang, The vaginal test I did yesterday showed yeast and bacterial change. I left you a brief message to see if you could call back. I know you don't like medications so I want to know how to you want to be treated. It will take two separate things to treat this. We can use an oral antibiotic for one and a vaginal cream for the other. If this is ok with you, let me know and I will call those into to the pharmacy. I will check over the weekend to see if you've responded via MyChart. I can do the rx over the weekend as well. Let me know.  Dr. Hyacinth MeekerMiller

## 2013-10-05 NOTE — Telephone Encounter (Signed)
Spoke with patient and message from Dr. Hyacinth MeekerMiller given. She had read message but wanted to be sure about the blood testing, advised the blood work did appear to be in normal range.   Patient is agreeable to treatment for yeast and BV. She states that the BV did respond well in the past to vaginal treatment but that she would do whatever Dr. Hyacinth MeekerMiller feels is best.   Advised would send her message to Dr. Hyacinth MeekerMiller and I would call her back when rx were sent.

## 2013-10-06 ENCOUNTER — Telehealth: Payer: Self-pay | Admitting: Obstetrics & Gynecology

## 2013-10-06 NOTE — Telephone Encounter (Signed)
Patient returning Sabrina's call. °

## 2013-10-06 NOTE — Telephone Encounter (Signed)
Left message for patient to call back. Need to go over benefits and schedule PUS °

## 2013-10-06 NOTE — Telephone Encounter (Signed)
Patient notified of rx sent to [facility]. Metrogel x 5 days qhs.Diflucan as instructed. Follow up prn.   Will close encounter.

## 2013-10-06 NOTE — Telephone Encounter (Signed)
Left message for patient to call back  

## 2013-10-22 NOTE — Telephone Encounter (Signed)
Left message for patient to call back  

## 2013-10-28 NOTE — Telephone Encounter (Signed)
Left message for patient to call back  

## 2013-10-28 NOTE — Telephone Encounter (Signed)
Spoke with patient. She declined this procedure.

## 2013-11-11 ENCOUNTER — Telehealth: Payer: Self-pay | Admitting: *Deleted

## 2013-11-11 NOTE — Telephone Encounter (Signed)
Spoke with patient. Advised that per benefit quote received, she will be responsible for $408.26. Patient states that she will check with her job to see when she can take off because they are currently in a black-out period. But that she will call back to schedule once she has determined when she can take off.  I advised patient that these procedures are performed in out office on Tuesday and Thursday afternoons.

## 2013-11-11 NOTE — Telephone Encounter (Signed)
Patient returned call. Advised of Dr Rondel BatonMiller's recommendation for ultrasound for evaluation of amenorrhea and she really feels this is important. Patient states she has high deductible plan and that is why she declined the test. She asked if she needd to call insurance company to check on amount, advised that we have contacted insurance company and transferred to [employer]  billing regarding estimate.

## 2013-11-11 NOTE — Telephone Encounter (Signed)
Message copied by Alisa GraffYEAKLEY, SARAH on Wed Nov 11, 2013  1:14 PM ------      Message from: Jerene BearsMILLER, MARY S      Created: Sun Nov 08, 2013 11:15 PM       Kennon RoundsSally,      Can you call this pt.  She has amenorrhea.  I wanted to do an ultrasound but she declined when sabrina called her.  Can you follow up with her.  I really think it is important. ------

## 2013-11-11 NOTE — Telephone Encounter (Signed)
Call to patient, LMTCB

## 2013-11-16 NOTE — Telephone Encounter (Signed)
Spoke with patient. Scheduled pus. Explained that payment is due at the time of service. Patient agreeable.

## 2013-11-16 NOTE — Telephone Encounter (Signed)
Patient returning Sabrina's call. She is ready to schedule.

## 2013-11-16 NOTE — Telephone Encounter (Signed)
Dr Hyacinth MeekerMiller, just FYI. Will closed encounter.

## 2013-11-17 ENCOUNTER — Telehealth: Payer: Self-pay | Admitting: Obstetrics & Gynecology

## 2013-11-17 NOTE — Telephone Encounter (Signed)
Patient called and cancelled her appointment for 11/19/13 for ultrasound and consult. No fee per Martie LeeSabrina as the appointment was just scheduled yesterday. Please call and reschedule.

## 2013-11-19 ENCOUNTER — Other Ambulatory Visit: Payer: BC Managed Care – PPO | Admitting: Obstetrics & Gynecology

## 2013-11-19 ENCOUNTER — Other Ambulatory Visit: Payer: BC Managed Care – PPO

## 2013-12-07 NOTE — Telephone Encounter (Signed)
Left message for patient to call back. Need to reschedule PUS °

## 2014-06-16 NOTE — Telephone Encounter (Signed)
This patient has not contacted us or been seen in our office since 10/01/13. Okay to close encounter?

## 2014-06-17 NOTE — Telephone Encounter (Signed)
Pt declined additional testing.  OK to close encounter.

## 2014-08-12 DIAGNOSIS — J309 Allergic rhinitis, unspecified: Secondary | ICD-10-CM | POA: Insufficient documentation

## 2014-08-12 DIAGNOSIS — B009 Herpesviral infection, unspecified: Secondary | ICD-10-CM | POA: Insufficient documentation

## 2014-08-19 ENCOUNTER — Telehealth: Payer: Self-pay | Admitting: Obstetrics & Gynecology

## 2014-08-19 NOTE — Telephone Encounter (Signed)
Not needed

## 2014-09-16 ENCOUNTER — Ambulatory Visit: Payer: Self-pay | Admitting: Certified Nurse Midwife

## 2014-09-23 ENCOUNTER — Ambulatory Visit: Payer: Self-pay | Admitting: Certified Nurse Midwife

## 2014-09-30 ENCOUNTER — Encounter: Payer: Self-pay | Admitting: Certified Nurse Midwife

## 2014-09-30 ENCOUNTER — Ambulatory Visit (INDEPENDENT_AMBULATORY_CARE_PROVIDER_SITE_OTHER): Payer: BLUE CROSS/BLUE SHIELD | Admitting: Certified Nurse Midwife

## 2014-09-30 VITALS — BP 100/62 | HR 68 | Resp 16 | Ht 63.25 in | Wt 120.0 lb

## 2014-09-30 DIAGNOSIS — N912 Amenorrhea, unspecified: Secondary | ICD-10-CM

## 2014-09-30 DIAGNOSIS — Z124 Encounter for screening for malignant neoplasm of cervix: Secondary | ICD-10-CM | POA: Diagnosis not present

## 2014-09-30 DIAGNOSIS — Z01419 Encounter for gynecological examination (general) (routine) without abnormal findings: Secondary | ICD-10-CM

## 2014-09-30 LAB — POCT URINE PREGNANCY: PREG TEST UR: NEGATIVE

## 2014-09-30 NOTE — Progress Notes (Signed)
29 y.o. G0P0000 Single  Caucasian Fe here for annual exam.  No periods since 2014. Patient now agreeable to other evaluation if needed... Contraception condoms working well. Has Yoga certification now, plans to teach! Has decreased her running distances to no marathon. Sees urgent care if needed. No HSV outbreaks. No partner change, no STD screening needed. No other health issues today.  Patient's last menstrual period was 05/09/2013.          Sexually active: Yes.    The current method of family planning is condoms most of the time.    Exercising: Yes.    Running, power vinyasa yoga Smoker:  no  Health Maintenance: Pap: 01-03-12 neg MMG:  none Colonoscopy:  none BMD:   none TDaP:  3/16 Labs: Upt-neg Self breast exam: done monthly   reports that she has never smoked. She does not have any smokeless tobacco history on file. She reports that she drinks alcohol. She reports that she does not use illicit drugs.  Past Medical History  Diagnosis Date  . STD (sexually transmitted disease)     HSV1  . ADD (attention deficit disorder)     Past Surgical History  Procedure Laterality Date  . Wisdom tooth extraction    . Jaw history      Current Outpatient Prescriptions  Medication Sig Dispense Refill  . fluticasone (FLONASE) 50 MCG/ACT nasal spray Place 1 spray into both nostrils daily.    . valACYclovir (VALTREX) 1000 MG tablet Take 1 tablet (1,000 mg total) by mouth as needed. 30 tablet 12   No current facility-administered medications for this visit.    Family History  Problem Relation Age of Onset  . Stroke Maternal Grandmother     ROS:  Pertinent items are noted in HPI.  Otherwise, a comprehensive ROS was negative.  Exam:   BP 100/62 mmHg  Pulse 68  Resp 16  Ht 5' 3.25" (1.607 m)  Wt 120 lb (54.432 kg)  BMI 21.08 kg/m2  LMP 05/09/2013 Height: 5' 3.25" (160.7 cm) Ht Readings from Last 3 Encounters:  09/30/14 5' 3.25" (1.607 m)  08/14/13 5' 3.25" (1.607 m)  01/21/13 5'  3.25" (1.607 m)    General appearance: alert, cooperative and appears stated age Head: Normocephalic, without obvious abnormality, atraumatic Neck: no adenopathy, supple, symmetrical, trachea midline and thyroid normal to inspection and palpation Lungs: clear to auscultation bilaterally Breasts: normal appearance, no masses or tenderness, No nipple retraction or dimpling, No nipple discharge or bleeding, No axillary or supraclavicular adenopathy Heart: regular rate and rhythm Abdomen: soft, non-tender; no masses,  no organomegaly Extremities: extremities normal, atraumatic, no cyanosis or edema Skin: Skin color, texture, turgor normal. No rashes or lesions Lymph nodes: Cervical, supraclavicular, and axillary nodes normal. No abnormal inguinal nodes palpated Neurologic: Grossly normal   Pelvic: External genitalia:  no lesions              Urethra:  normal appearing urethra with no masses, tenderness or lesions              Bartholin's and Skene's: normal                 Vagina: normal appearing vagina with normal color and discharge, no lesions              Cervix: normal,non tender, no lesions              Pap taken: Yes.   Bimanual Exam:  Uterus:  normal size, contour, position, consistency,  mobility, non-tender              Adnexa: normal adnexa and no mass, fullness, tenderness               Rectovaginal: Confirms               Anus:  normal sphincter tone, no lesions  Chaperone present: Yes  A:  Well Woman with normal exam  Contraception condoms  Prolonged amenorrhea with no labs in past year, had Provera challenge with no bleeding  Patient is no longer running long distances  P:   Reviewed health and wellness pertinent to exam  Discussed lab evaluation again for amenorrhea, patient agreeable. Patient also agreeable if PUS needed to assess ovaries after lab results in.  Labs: TSH,FSH, Estradiol, Prolactin,  Pap smear taken today with HPV reflex   counseled on breast self  exam, STD prevention, HIV risk factors and prevention, adequate intake of calcium and vitamin D  return annually or prn  An After Visit Summary was printed and given to the patient.

## 2014-09-30 NOTE — Patient Instructions (Signed)
General topics  Next pap or exam is  due in 1 year Take a Women's multivitamin Take 1200 mg. of calcium daily - prefer dietary If any concerns in interim to call back  Breast Self-Awareness Practicing breast self-awareness may pick up problems early, prevent significant medical complications, and possibly save your life. By practicing breast self-awareness, you can become familiar with how your breasts look and feel and if your breasts are changing. This allows you to notice changes early. It can also offer you some reassurance that your breast health is good. One way to learn what is normal for your breasts and whether your breasts are changing is to do a breast self-exam. If you find a lump or something that was not present in the past, it is best to contact your caregiver right away. Other findings that should be evaluated by your caregiver include nipple discharge, especially if it is bloody; skin changes or reddening; areas where the skin seems to be pulled in (retracted); or new lumps and bumps. Breast pain is seldom associated with cancer (malignancy), but should also be evaluated by a caregiver. BREAST SELF-EXAM The best time to examine your breasts is 5 7 days after your menstrual period is over.  ExitCare Patient Information 2013 [employer].   Exercise to Stay Healthy Exercise helps you become and stay healthy. EXERCISE IDEAS AND TIPS Choose exercises that:  You enjoy.  Fit into your day. You do not need to exercise really hard to be healthy. You can do exercises at a slow or medium level and stay healthy. You can:  Stretch before and after working out.  Try yoga, Pilates, or tai chi.  Lift weights.  Walk fast, swim, jog, run, climb stairs, bicycle, dance, or rollerskate.  Take aerobic classes. Exercises that burn about 150 calories:  Running 1  miles in 15 minutes.  Playing volleyball for 45 to 60 minutes.  Washing and waxing a car for 45 to 60  minutes.  Playing touch football for 45 minutes.  Walking 1  miles in 35 minutes.  Pushing a stroller 1  miles in 30 minutes.  Playing basketball for 30 minutes.  Raking leaves for 30 minutes.  Bicycling 5 miles in 30 minutes.  Walking 2 miles in 30 minutes.  Dancing for 30 minutes.  Shoveling snow for 15 minutes.  Swimming laps for 20 minutes.  Walking up stairs for 15 minutes.  Bicycling 4 miles in 15 minutes.  Gardening for 30 to 45 minutes.  Jumping rope for 15 minutes.  Washing windows or floors for 45 to 60 minutes. Document Released: 06/16/2010 Document Revised: 08/06/2011 Document Reviewed: 06/16/2010 ExitCare Patient Information 2013 [employer].   Other topics ( that may be useful information):    Sexually Transmitted Disease Sexually transmitted disease (STD) refers to any infection that is passed from person to person during sexual activity. This may happen by way of saliva, semen, blood, vaginal mucus, or urine. Common STDs include:  Gonorrhea.  Chlamydia.  Syphilis.  HIV/AIDS.  Genital herpes.  Hepatitis B and C.  Trichomonas.  Human papillomavirus (HPV).  Pubic lice. CAUSES  An STD may be spread by bacteria, virus, or parasite. A person can get an STD by:  Sexual intercourse with an infected person.  Sharing sex toys with an infected person.  Sharing needles with an infected person.  Having intimate contact with the genitals, mouth, or rectal areas of an infected person. SYMPTOMS  Some people may not have any symptoms, but   they can still pass the infection to others. Different STDs have different symptoms. Symptoms include:  Painful or bloody urination.  Pain in the pelvis, abdomen, vagina, anus, throat, or eyes.  Skin rash, itching, irritation, growths, or sores (lesions). These usually occur in the genital or anal area.  Abnormal vaginal discharge.  Penile discharge in men.  Soft, flesh-colored skin growths in the  genital or anal area.  Fever.  Pain or bleeding during sexual intercourse.  Swollen glands in the groin area.  Yellow skin and eyes (jaundice). This is seen with hepatitis. DIAGNOSIS  To make a diagnosis, your caregiver may:  Take a medical history.  Perform a physical exam.  Take a specimen (culture) to be examined.  Examine a sample of discharge under a microscope.  Perform blood test TREATMENT   Chlamydia, gonorrhea, trichomonas, and syphilis can be cured with antibiotic medicine.  Genital herpes, hepatitis, and HIV can be treated, but not cured, with prescribed medicines. The medicines will lessen the symptoms.  Genital warts from HPV can be treated with medicine or by freezing, burning (electrocautery), or surgery. Warts may come back.  HPV is a virus and cannot be cured with medicine or surgery.However, abnormal areas may be followed very closely by your caregiver and may be removed from the cervix, vagina, or vulva through office procedures or surgery. If your diagnosis is confirmed, your recent sexual partners need treatment. This is true even if they are symptom-free or have a negative culture or evaluation. They should not have sex until their caregiver says it is okay. HOME CARE INSTRUCTIONS  All sexual partners should be informed, tested, and treated for all STDs.  Take your antibiotics as directed. Finish them even if you start to feel better.  Only take over-the-counter or prescription medicines for pain, discomfort, or fever as directed by your caregiver.  Rest.  Eat a balanced diet and drink enough fluids to keep your urine clear or pale yellow.  Do not have sex until treatment is completed and you have followed up with your caregiver. STDs should be checked after treatment.  Keep all follow-up appointments, Pap tests, and blood tests as directed by your caregiver.  Only use latex condoms and water-soluble lubricants during sexual activity. Do not use  petroleum jelly or oils.  Avoid alcohol and illegal drugs.  Get vaccinated for HPV and hepatitis. If you have not received these vaccines in the past, talk to your caregiver about whether one or both might be right for you.  Avoid risky sex practices that can break the skin. The only way to avoid getting an STD is to avoid all sexual activity.Latex condoms and dental dams (for oral sex) will help lessen the risk of getting an STD, but will not completely eliminate the risk. SEEK MEDICAL CARE IF:   You have a fever.  You have any new or worsening symptoms. Document Released: 08/04/2002 Document Revised: 08/06/2011 Document Reviewed: 08/11/2010 [facility] Patient Information 2013 [employer].    Domestic Abuse You are being battered or abused if someone close to you hits, pushes, or physically hurts you in any way. You also are being abused if you are forced into activities. You are being sexually abused if you are forced to have sexual contact of any kind. You are being emotionally abused if you are made to feel worthless or if you are constantly threatened. It is important to remember that help is available. No one has the right to abuse you. PREVENTION OF FURTHER  ABUSE  Learn the warning signs of danger. This varies with situations but may include: the use of alcohol, threats, isolation from friends and family, or forced sexual contact. Leave if you feel that violence is going to occur.  If you are attacked or beaten, report it to the police so the abuse is documented. You do not have to press charges. The police can protect you while you or the attackers are leaving. Get the officer's name and badge number and a copy of the report.  Find someone you can trust and tell them what is happening to you: your caregiver, a nurse, clergy member, close friend or family member. Feeling ashamed is natural, but remember that you have done nothing wrong. No one deserves abuse. Document Released:  05/11/2000 Document Revised: 08/06/2011 Document Reviewed: 07/20/2010 ExitCare Patient Information 2013 [employer]Care[employer].    How Much is Too Much Alcohol? Drinking too much alcohol can cause injury, accidents, and health problems. These types of problems can include:   Car crashes.  Falls.  Family fighting (domestic violence).  Drowning.  Fights.  Injuries.  Burns.  Damage to certain organs.  Having a baby with birth defects. ONE DRINK CAN BE TOO MUCH WHEN YOU ARE:  Working.  Pregnant or breastfeeding.  Taking medicines. Ask your doctor.  Driving or planning to drive. If you or someone you know has a drinking problem, get help from a doctor.  Document Released: 03/10/2009 Document Revised: 08/06/2011 Document Reviewed: 03/10/2009 ExitCare Patient Information 2013 [employer]Care[employer].   Smoking Hazards Smoking cigarettes is extremely bad for your health. Tobacco smoke has over 200 known poisons in it. There are over 60 chemicals in tobacco smoke that cause cancer. Some of the chemicals found in cigarette smoke include:   Cyanide.  Benzene.  Formaldehyde.  Methanol (wood alcohol).  Acetylene (fuel used in welding torches).  Ammonia. Cigarette smoke also contains the poisonous gases nitrogen oxide and carbon monoxide.  Cigarette smokers have an increased risk of many serious medical problems and Smoking causes approximately:  90% of all lung cancer deaths in men.  80% of all lung cancer deaths in women.  90% of deaths from chronic obstructive lung disease. Compared with nonsmokers, smoking increases the risk of:  Coronary heart disease by 2 to 4 times.  Stroke by 2 to 4 times.  Men developing lung cancer by 23 times.  Women developing lung cancer by 13 times.  Dying from chronic obstructive lung diseases by 12 times.  . Smoking is the most preventable cause of death and disease in our society.  WHY IS SMOKING ADDICTIVE?  Nicotine is the chemical  agent in tobacco that is capable of causing addiction or dependence.  When you smoke and inhale, nicotine is absorbed rapidly into the bloodstream through your lungs. Nicotine absorbed through the lungs is capable of creating a powerful addiction. Both inhaled and non-inhaled nicotine may be addictive.  Addiction studies of cigarettes and spit tobacco show that addiction to nicotine occurs mainly during the teen years, when young people begin using tobacco products. WHAT ARE THE BENEFITS OF QUITTING?  There are many health benefits to quitting smoking.   Likelihood of developing cancer and heart disease decreases. Health improvements are seen almost immediately.  Blood pressure, pulse rate, and breathing patterns start returning to normal soon after quitting. QUITTING SMOKING   American Lung Association - 1-800-LUNGUSA  American Cancer Society - 1-800-ACS-2345 Document Released: 06/21/2004 Document Revised: 08/06/2011 Document Reviewed: 02/23/2009 ExitCare Patient Information 2013 ExitCare,   LLC.   Stress Management Stress is a state of physical or mental tension that often results from changes in your life or normal routine. Some common causes of stress are:  Death of a loved one.  Injuries or severe illnesses.  Getting fired or changing jobs.  Moving into a new home. Other causes may be:  Sexual problems.  Business or financial losses.  Taking on a large debt.  Regular conflict with someone at home or at work.  Constant tiredness from lack of sleep. It is not just bad things that are stressful. It may be stressful to:  Win the lottery.  Get married.  Buy a new car. The amount of stress that can be easily tolerated varies from person to person. Changes generally cause stress, regardless of the types of change. Too much stress can affect your health. It may lead to physical or emotional problems. Too little stress (boredom) may also become stressful. SUGGESTIONS TO  REDUCE STRESS:  Talk things over with your family and friends. It often is helpful to share your concerns and worries. If you feel your problem is serious, you may want to get help from a professional counselor.  Consider your problems one at a time instead of lumping them all together. Trying to take care of everything at once may seem impossible. List all the things you need to do and then start with the most important one. Set a goal to accomplish 2 or 3 things each day. If you expect to do too many in a single day you will naturally fail, causing you to feel even more stressed.  Do not use alcohol or drugs to relieve stress. Although you may feel better for a short time, they do not remove the problems that caused the stress. They can also be habit forming.  Exercise regularly - at least 3 times per week. Physical exercise can help to relieve that "uptight" feeling and will relax you.  The shortest distance between despair and hope is often a good night's sleep.  Go to bed and get up on time allowing yourself time for appointments without being rushed.  Take a short "time-out" period from any stressful situation that occurs during the day. Close your eyes and take some deep breaths. Starting with the muscles in your face, tense them, hold it for a few seconds, then relax. Repeat this with the muscles in your neck, shoulders, hand, stomach, back and legs.  Take good care of yourself. Eat a balanced diet and get plenty of rest.  Schedule time for having fun. Take a break from your daily routine to relax. HOME CARE INSTRUCTIONS   Call if you feel overwhelmed by your problems and feel you can no longer manage them on your own.  Return immediately if you feel like hurting yourself or someone else. Document Released: 11/07/2000 Document Revised: 08/06/2011 Document Reviewed: 06/30/2007 ExitCare Patient Information 2013 ExitCare, [employer].   

## 2014-10-01 LAB — TSH: TSH: 1.189 u[IU]/mL (ref 0.350–4.500)

## 2014-10-01 LAB — FOLLICLE STIMULATING HORMONE: FSH: 5.4 m[IU]/mL

## 2014-10-01 LAB — ESTRADIOL: Estradiol: 21 pg/mL

## 2014-10-01 LAB — PROLACTIN: Prolactin: 2.2 ng/mL

## 2014-10-02 NOTE — Progress Notes (Signed)
Reviewed personally.  M. Suzanne Linkyn Gobin, MD.  

## 2014-10-04 ENCOUNTER — Telehealth: Payer: Self-pay | Admitting: Certified Nurse Midwife

## 2014-10-04 NOTE — Telephone Encounter (Signed)
Call to patient. Advised of benefit quote received for PUS. °Patient agreeable. Scheduled PUS. °Advised patient of 72 hour cancellation policy and $100 cancellation fee. Patient agreeable. °

## 2014-10-05 LAB — IPS PAP TEST WITH REFLEX TO HPV

## 2014-10-06 ENCOUNTER — Telehealth: Payer: Self-pay | Admitting: Obstetrics & Gynecology

## 2014-10-06 NOTE — Telephone Encounter (Signed)
Contacted the patient to address her questions.

## 2014-10-06 NOTE — Telephone Encounter (Signed)
Patient called with billing questions regarding PUS scheduled for next week.  Advised that I would have billing personnel contact her.

## 2014-10-14 ENCOUNTER — Ambulatory Visit (INDEPENDENT_AMBULATORY_CARE_PROVIDER_SITE_OTHER): Payer: BLUE CROSS/BLUE SHIELD | Admitting: Obstetrics & Gynecology

## 2014-10-14 ENCOUNTER — Encounter: Payer: Self-pay | Admitting: Obstetrics & Gynecology

## 2014-10-14 ENCOUNTER — Ambulatory Visit (INDEPENDENT_AMBULATORY_CARE_PROVIDER_SITE_OTHER): Payer: BLUE CROSS/BLUE SHIELD

## 2014-10-14 VITALS — BP 102/62 | Wt 119.0 lb

## 2014-10-14 DIAGNOSIS — N912 Amenorrhea, unspecified: Secondary | ICD-10-CM | POA: Diagnosis not present

## 2014-10-14 NOTE — Progress Notes (Signed)
29 y.o. G0 Singlefemale here for a pelvic ultrasound due to prolonged amenorrhea.  Pt recently has FSH, prolactin, and TSH levels as well as estradiol level.  FSH was 5.4.  Prolactin and TSH were normal.  Estradiol was 21.    Pt still running 3 miles up to five times weekly.  Had probable stress fracture on foot vs achilles tendonitis and couldn't keep up the 25-35 miles a week she was running.  Pt eats dairy free and no red meat.  She gets most fat in her diet from avocado and nuts/seeds.    In a good relationship right now.  Not contemplating marriage.  "Too early".  Not even sure she wants children in the future but would like the option to be open at this time.  Patient's last menstrual period was 05/09/2013.  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  Risks of bone disease later in life discussed without added estrogen supplementation.  OCPs and/or estradiol discussed.  Pt is apprehensive to add any hormonal therapy at this time.   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  Risks of osteoporosis specifically discussed.  Pt apprehensive to starting HRT.  Feels OCPs may have caused this issue.  Also discussed possibly starting estrogen patch, at first, if pt is not willing to be back on OCPs.  Diet and exercise modification discussed.  Pt is eating very healthily and feels this is the right thing for her.  D/W pt it may only take a few pounds or mild increase in fat to allow cycling.  Pt apprehensive to make any changes.  End[facility]ed cond[facility] for [facility].  Pt is using this method faithfully.  Calcium supplementation and Vit D supplementation  (goal of 1200mg  calcium and 800-1000IU Vit D daily discussed).  Pt will keep foot diary to see what she needs.  Will also look at supplements to see what else she needs.  Referral to Dr. April MansonYalcinkaya for second opinion or possible additional thoughts regarding amenorrhea/estrogen administration in this pt.    ~25 minutes spent with patient >50% of time was in face to face discussion of above.

## 2014-11-19 ENCOUNTER — Telehealth: Payer: Self-pay | Admitting: Obstetrics & Gynecology

## 2014-11-19 NOTE — Telephone Encounter (Signed)
Ms Hennick was referred to Dr April Manson and has not been scheduled as of 11/18/14. Per Zella Ball at [facility], they left a voicemail for Ms Wellbaum on 10/29/14 and are waiting on her return call to schedule. To Dr Hyacinth Meeker to sign off.

## 2014-12-02 ENCOUNTER — Telehealth: Payer: Self-pay | Admitting: Obstetrics & Gynecology

## 2014-12-02 NOTE — Telephone Encounter (Signed)
Dr Hyacinth MeekerMiller - I called [facility] to check the status of a referral for Ms Iran OuchStrader. Zella BallRobin stated that they attempted calls with voicemail on 10/29/14 and 11/25/14. How would you like to proceed with this?

## 2014-12-02 NOTE — Telephone Encounter (Signed)
Referral has been made and several call to pt have been made.  Non-compliance has been documented.  Ok to remove referral and close encounter.  Thanks.

## 2015-08-02 ENCOUNTER — Telehealth: Payer: Self-pay | Admitting: Certified Nurse Midwife

## 2015-08-02 NOTE — Telephone Encounter (Signed)
FYI: [facility]. They have reached out to her several times for a referral appointment and have not been able to schedule the patient. They are calling to let the provider know they will be disregarding this referral.

## 2015-08-02 NOTE — Telephone Encounter (Signed)
Routing to Dr.Miller as Lorain ChildesFYI. Dr.Yalcinkaya's office has reached out to the patient regarding referral appointment without a return call from the patient. Referral was placed in 09/2014. Will close encounter.

## 2017-11-03 ENCOUNTER — Emergency Department (HOSPITAL_COMMUNITY)
Admission: EM | Admit: 2017-11-03 | Discharge: 2017-11-03 | Disposition: A | Discharge status: Home / Self Care | Payer: Managed Care, Other (non HMO) | Attending: Emergency Medicine | Admitting: Emergency Medicine

## 2017-11-03 ENCOUNTER — Encounter (HOSPITAL_COMMUNITY): Payer: Self-pay | Admitting: Emergency Medicine

## 2017-11-03 DIAGNOSIS — Y999 Unspecified external cause status: Secondary | ICD-10-CM | POA: Insufficient documentation

## 2017-11-03 DIAGNOSIS — Z79899 Other long term (current) drug therapy: Secondary | ICD-10-CM | POA: Insufficient documentation

## 2017-11-03 DIAGNOSIS — T63441A Toxic effect of venom of bees, accidental (unintentional), initial encounter: Secondary | ICD-10-CM | POA: Diagnosis not present

## 2017-11-03 DIAGNOSIS — Y929 Unspecified place or not applicable: Secondary | ICD-10-CM | POA: Insufficient documentation

## 2017-11-03 DIAGNOSIS — T782XXA Anaphylactic shock, unspecified, initial encounter: Secondary | ICD-10-CM | POA: Insufficient documentation

## 2017-11-03 DIAGNOSIS — Y939 Activity, unspecified: Secondary | ICD-10-CM | POA: Insufficient documentation

## 2017-11-03 DIAGNOSIS — T7840XA Allergy, unspecified, initial encounter: Secondary | ICD-10-CM | POA: Diagnosis present

## 2017-11-03 MED ORDER — EPINEPHRINE 0.3 MG/0.3ML IJ SOAJ
0.3000 mg | Freq: Once | INTRAMUSCULAR | Status: AC
  Administered 2017-11-03: 0.3 mg via INTRAMUSCULAR

## 2017-11-03 MED ORDER — EPINEPHRINE 0.3 MG/0.3ML IJ SOAJ: 0.3000 mg | Freq: Once | INTRAMUSCULAR | 0 refills | Status: AC

## 2017-11-03 MED ORDER — FAMOTIDINE IN NACL 20-0.9 MG/50ML-% IV SOLN
20.0000 mg | Freq: Once | INTRAVENOUS | Status: AC
  Administered 2017-11-03: 20 mg via INTRAVENOUS
  Filled 2017-11-03: qty 50

## 2017-11-03 MED ORDER — EPINEPHRINE 0.3 MG/0.3ML IJ SOAJ
INTRAMUSCULAR | Status: AC
  Filled 2017-11-03: qty 0.3

## 2017-11-03 MED ORDER — DIPHENHYDRAMINE HCL 50 MG/ML IJ SOLN
25.0000 mg | Freq: Once | INTRAMUSCULAR | Status: AC
  Administered 2017-11-03: 25 mg via INTRAVENOUS
  Filled 2017-11-03: qty 1

## 2017-11-03 MED ORDER — PREDNISONE 20 MG PO TABS: 40.0000 mg | ORAL_TABLET | Freq: Every day | ORAL | 0 refills | Status: AC

## 2017-11-03 MED ORDER — METHYLPREDNISOLONE SODIUM SUCC 125 MG IJ SOLR
125.0000 mg | Freq: Once | INTRAMUSCULAR | Status: AC
  Administered 2017-11-03: 125 mg via INTRAVENOUS
  Filled 2017-11-03: qty 2

## 2017-11-03 NOTE — ED Provider Notes (Signed)
Patient was stung by bee on her left wrist 9 AM today.  She reports a 10 minutes later she developed diffuse itching, and red rash, as well as slight swelling of her throat.  She denies any chest pain or shortness of breath.  Feels much improved since treatment here.  On exam no respiratory distress.  No hoarseness.  Skin with hive-like rash, diffuse, faint.  Left upper extremity no stinger present   Doug SouJacubowitz, Darnice Comrie, MD 11/03/17 1122

## 2017-11-03 NOTE — ED Triage Notes (Signed)
Pt was at home got stung by a bee on left hand. Currently NAD Aox4

## 2017-11-03 NOTE — ED Provider Notes (Signed)
[facility]   CSN: [id number] Arrival date & time: 11/03/17  1018     History   Chief Complaint Chief Complaint  Patient presents with  . Insect Bite    HPI Deanna Chang is a 32 y.o. female.  HPI  Deanna Chang is a 32 year old female with a history of ADD who presents to the emergency department for evaluation of anaphylaxis.  Patient reports that she was stung by a bee on the dorsum of her left hand about 9 AM today.  She immediately had total body itching and noticed hives on her legs and arms.  States that she feels as if her throat is closing.  Denies shortness of breath, states that she feels very anxious.  She took 50 mg Benadryl prior to arrival.  States that she had a delayed allergic reaction to a bee sting last year, but it has never been this severe before.  Denies any other known allergies.  She denies fevers, chills, chest pain, shortness of breath, abdominal pain, nausea/vomiting, lightheadedness or syncope.  Past Medical History:  Diagnosis Date  . ADD (attention deficit disorder)   . STD (sexually transmitted disease)    HSV1    Patient Active Problem List   Diagnosis Date Noted  . Allergic rhinitis 08/12/2014  . 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 jaw history    . WISDOM TOOTH EXTRACTION       OB History    Gravida  0   Para  0   Term  0   Preterm  0   AB  0   Living  0     SAB  0   TAB  0   Ectopic  0   Multiple  0   Live Births               Home Medications    Prior to Admission medications   Medication Sig Start Date End Date Taking? Authorizing Provider  fluticasone (FLONASE) 50 MCG/ACT nasal spray Place 1 spray into both nostrils daily. 09/24/13   [provider]  valACYclovir (VALTREX) 1000 MG tablet Take 1 tablet (1,000 mg total) by mouth as needed. 01/21/13   Verner CholLeonard, Deborah S, CNM     Family History Family History  Problem Relation Age of Onset  . Stroke Maternal Grandmother     Social History Social History   Tobacco Use  . Smoking status: Never Smoker  Substance Use Topics  . Alcohol use: Yes    Alcohol/week: 0.0 - 1.8 oz  . Drug use: No     Allergies   Bee venom   Review of Systems Review of Systems  Constitutional: Negative for chills and fever.  HENT: Positive for facial swelling (feels throat closing).   Eyes: Negative for visual disturbance.  Respiratory: Negative for shortness of breath.   Cardiovascular: Negative for chest pain.  Gastrointestinal: Negative for abdominal pain, nausea and vomiting.  Genitourinary: Negative for difficulty urinating.  Musculoskeletal: Negative for gait problem.  Skin: Positive for rash.  Neurological: Negative for weakness, light-headedness and numbness.  Psychiatric/Behavioral: The patient is nervous/anxious.      Physical Exam Updated Vital Signs BP 115/68 (BP Location: Right Arm)   Pulse 77   Temp 98 F (36.7 C) (Oral)   Resp 16   Ht 5\' 2"  (1.575 m)   Wt 56.7 kg (125 lb)   SpO2  96%   BMI 22.86 kg/m   Physical Exam  Constitutional: She appears well-developed and well-nourished. No distress.  Appears very anxious, distressed.  HENT:  Head: Normocephalic and atraumatic.  Mouth/Throat: Oropharynx is clear and moist.  No angioedema, airway patent.  Eyes: Pupils are equal, round, and reactive to light. Conjunctivae are normal. Right eye exhibits no discharge. Left eye exhibits no discharge.  Neck: Normal range of motion. Neck supple.  Cardiovascular: Normal rate, regular rhythm and intact distal pulses.  Pulmonary/Chest: Effort normal and breath sounds normal. No stridor. No respiratory distress. She has no wheezes. She has no rales.  Abdominal: Soft. There is no tenderness.  Neurological: She is alert. Coordination normal.  Skin: Skin is warm and dry. Capillary refill takes less than 2  seconds. She is not diaphoretic.  Erythematous, raised urticaria diffusely on abdomen, back and bilateral thighs.  Psychiatric: She has a normal mood and affect. Her behavior is normal.  Nursing note and vitals reviewed.    ED Treatments / Results  Labs (all labs ordered are listed, but only abnormal results are displayed) Labs Reviewed - No data to display  EKG None  Radiology No results found.  Procedures Procedures (including critical care time)  Medications Ordered in ED Medications  EPINEPHrine (EPI-PEN) injection 0.3 mg (0.3 mg Intramuscular Given 11/03/17 1035)  methylPREDNISolone sodium succinate (SOLU-MEDROL) 125 mg/2 mL injection 125 mg (125 mg Intravenous Given 11/03/17 1035)  famotidine (PEPCID) IVPB 20 mg premix (0 mg Intravenous Stopped 11/03/17 1106)  diphenhydrAMINE (BENADRYL) injection 25 mg (25 mg Intravenous Given 11/03/17 1036)     Initial Impression / Assessment and Plan / ED Course  I have reviewed the triage vital signs and the nursing notes.  Pertinent labs & imaging results that were available during my care of the patient were reviewed by me and considered in my medical decision making (see chart for details).     Presents with anaphylaxis. Patient treated with IM epinephrine, solumedrol, pepcid and benadryl. Patient monitored in the ED for >4hrs. Re-evaluated prior to dc, is hemodynamically stable, in no respiratory distress, and denies the feeling of throat closing. Urticaria resolved. Pt has been advised to take OTC benadryl & return to the ED if they have a mod-severe allergic rxn (s/s including throat closing, difficulty breathing, swelling of lips face or tongue). Will d/c with prednisone burst and EpiPEN script. Pt is to follow up with her PCP regarding today's ER visit. Pt is agreeable with plan & verbalizes understanding.  Final Clinical Impressions(s) / ED Diagnoses   Final diagnoses:  Anaphylaxis, initial encounter    ED Discharge Orders         Ordered    predniSONE (DELTASONE) 20 MG tablet  Daily     11/03/17 1510    EPINEPHrine 0.3 mg/0.3 mL IJ SOAJ injection   Once     11/03/17 1510       Kellie Shropshire, PA-C 11/03/17 1646    Doug Sou, MD 11/03/17 1806

## 2017-11-03 NOTE — Discharge Instructions (Addendum)
Please take steroid starting tomorrow.   I have written you a prescription for EpiPEN.   Take benadryl as needed for itching  Return to the ER for any new or concerning symptoms like trouble breathing, throat closing.

## 2019-03-14 ENCOUNTER — Emergency Department (HOSPITAL_COMMUNITY)
Admission: EM | Admit: 2019-03-14 | Discharge: 2019-03-15 | Disposition: A | Discharge status: Home / Self Care | Payer: Managed Care, Other (non HMO) | Attending: Emergency Medicine | Admitting: Emergency Medicine

## 2019-03-14 ENCOUNTER — Encounter (HOSPITAL_COMMUNITY): Payer: Self-pay | Admitting: Emergency Medicine

## 2019-03-14 ENCOUNTER — Other Ambulatory Visit: Payer: Self-pay

## 2019-03-14 DIAGNOSIS — S0090XA Unspecified superficial injury of unspecified part of head, initial encounter: Secondary | ICD-10-CM | POA: Diagnosis present

## 2019-03-14 DIAGNOSIS — S0990XA Unspecified injury of head, initial encounter: Secondary | ICD-10-CM

## 2019-03-14 DIAGNOSIS — Y998 Other external cause status: Secondary | ICD-10-CM | POA: Insufficient documentation

## 2019-03-14 DIAGNOSIS — Y929 Unspecified place or not applicable: Secondary | ICD-10-CM | POA: Diagnosis not present

## 2019-03-14 DIAGNOSIS — S90811A Abrasion, right foot, initial encounter: Secondary | ICD-10-CM | POA: Diagnosis not present

## 2019-03-14 DIAGNOSIS — S60811A Abrasion of right wrist, initial encounter: Secondary | ICD-10-CM | POA: Diagnosis not present

## 2019-03-14 DIAGNOSIS — Z79899 Other long term (current) drug therapy: Secondary | ICD-10-CM | POA: Diagnosis not present

## 2019-03-14 DIAGNOSIS — Y9389 Activity, other specified: Secondary | ICD-10-CM | POA: Diagnosis not present

## 2019-03-14 DIAGNOSIS — S0101XA Laceration without foreign body of scalp, initial encounter: Secondary | ICD-10-CM | POA: Insufficient documentation

## 2019-03-14 NOTE — ED Notes (Signed)
Suture cart at bedside 
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
## 2019-03-15 MED ORDER — ACETAMINOPHEN 500 MG PO TABS
1000.0000 mg | ORAL_TABLET | Freq: Once | ORAL | Status: DC
  Filled 2019-03-15: qty 2

## 2019-03-15 NOTE — Discharge Instructions (Signed)
Please follow-up in 5 to 7 days to have your staples removed.

## 2019-03-15 NOTE — ED Notes (Signed)
Pt refused PO tylenol prior to d/c due to cost concern, stated she would take some at home.
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
## 2019-05-14 ENCOUNTER — Other Ambulatory Visit: Payer: Managed Care, Other (non HMO)
